# Patient Record
Sex: Female | Born: 1960 | Race: White | Hispanic: No | Marital: Married | Smoking: Former smoker
Health system: Southern US, Community
[De-identification: ages and names within clinical notes are randomized; demographics above are authoritative.]

## PROBLEM LIST (undated history)

## (undated) DIAGNOSIS — H269 Unspecified cataract: Secondary | ICD-10-CM

## (undated) DIAGNOSIS — T7840XA Allergy, unspecified, initial encounter: Secondary | ICD-10-CM

## (undated) DIAGNOSIS — R569 Unspecified convulsions: Secondary | ICD-10-CM

## (undated) DIAGNOSIS — N390 Urinary tract infection, site not specified: Secondary | ICD-10-CM

## (undated) DIAGNOSIS — E119 Type 2 diabetes mellitus without complications: Secondary | ICD-10-CM

## (undated) DIAGNOSIS — R011 Cardiac murmur, unspecified: Secondary | ICD-10-CM

## (undated) DIAGNOSIS — J45909 Unspecified asthma, uncomplicated: Secondary | ICD-10-CM

## (undated) DIAGNOSIS — K219 Gastro-esophageal reflux disease without esophagitis: Secondary | ICD-10-CM

## (undated) DIAGNOSIS — G43909 Migraine, unspecified, not intractable, without status migrainosus: Secondary | ICD-10-CM

## (undated) DIAGNOSIS — J40 Bronchitis, not specified as acute or chronic: Secondary | ICD-10-CM

## (undated) DIAGNOSIS — I10 Essential (primary) hypertension: Secondary | ICD-10-CM

## (undated) DIAGNOSIS — F419 Anxiety disorder, unspecified: Secondary | ICD-10-CM

## (undated) DIAGNOSIS — E785 Hyperlipidemia, unspecified: Secondary | ICD-10-CM

## (undated) HISTORY — PX: COLONOSCOPY: SHX174

## (undated) HISTORY — DX: Hyperlipidemia, unspecified: E78.5

## (undated) HISTORY — DX: Unspecified convulsions: R56.9

## (undated) HISTORY — DX: Essential (primary) hypertension: I10

## (undated) HISTORY — DX: Bronchitis, not specified as acute or chronic: J40

## (undated) HISTORY — PX: HERNIA REPAIR: SHX51

## (undated) HISTORY — DX: Unspecified cataract: H26.9

## (undated) HISTORY — DX: Urinary tract infection, site not specified: N39.0

## (undated) HISTORY — PX: CHOLECYSTECTOMY: SHX55

## (undated) HISTORY — DX: Allergy, unspecified, initial encounter: T78.40XA

## (undated) HISTORY — DX: Anxiety disorder, unspecified: F41.9

## (undated) HISTORY — DX: Gastro-esophageal reflux disease without esophagitis: K21.9

## (undated) HISTORY — DX: Cardiac murmur, unspecified: R01.1

## (undated) HISTORY — PX: ESOPHAGOGASTRODUODENOSCOPY: SHX1529

## (undated) HISTORY — DX: Type 2 diabetes mellitus without complications: E11.9

## (undated) HISTORY — DX: Unspecified asthma, uncomplicated: J45.909

## (undated) HISTORY — PX: BREAST LUMPECTOMY: SHX2

## (undated) HISTORY — DX: Migraine, unspecified, not intractable, without status migrainosus: G43.909

---

## 1981-10-09 HISTORY — PX: PARTIAL HYSTERECTOMY: SHX80

## 1991-10-10 HISTORY — PX: HIP ARTHROSCOPY: SUR88

## 2017-11-14 DIAGNOSIS — I10 Essential (primary) hypertension: Secondary | ICD-10-CM | POA: Diagnosis not present

## 2017-11-14 DIAGNOSIS — R079 Chest pain, unspecified: Secondary | ICD-10-CM

## 2017-11-14 DIAGNOSIS — E119 Type 2 diabetes mellitus without complications: Secondary | ICD-10-CM

## 2017-11-14 DIAGNOSIS — E875 Hyperkalemia: Secondary | ICD-10-CM

## 2018-10-09 HISTORY — PX: BREAST BIOPSY: SHX20

## 2019-03-05 ENCOUNTER — Telehealth (INDEPENDENT_AMBULATORY_CARE_PROVIDER_SITE_OTHER): Payer: Medicare Other | Admitting: Gastroenterology

## 2019-03-05 ENCOUNTER — Other Ambulatory Visit: Payer: Self-pay

## 2019-03-05 ENCOUNTER — Encounter: Payer: Self-pay | Admitting: Gastroenterology

## 2019-03-05 VITALS — Ht 61.0 in | Wt 185.0 lb

## 2019-03-05 DIAGNOSIS — Z1211 Encounter for screening for malignant neoplasm of colon: Secondary | ICD-10-CM | POA: Diagnosis not present

## 2019-03-05 DIAGNOSIS — K449 Diaphragmatic hernia without obstruction or gangrene: Secondary | ICD-10-CM

## 2019-03-05 DIAGNOSIS — R1310 Dysphagia, unspecified: Secondary | ICD-10-CM | POA: Diagnosis not present

## 2019-03-05 DIAGNOSIS — K219 Gastro-esophageal reflux disease without esophagitis: Secondary | ICD-10-CM

## 2019-03-05 DIAGNOSIS — R112 Nausea with vomiting, unspecified: Secondary | ICD-10-CM

## 2019-03-05 DIAGNOSIS — R1319 Other dysphagia: Secondary | ICD-10-CM

## 2019-03-05 NOTE — Progress Notes (Signed)
Chief Complaint: GERD, hiatal hernia, dysphagia  Referring Provider:     Self   HPI:    Due to current restrictions/limitations of in-office visits due to the COVID-19 pandemic, this scheduled clinical appointment was converted to a telehealth virtual consultation.  -Time of medical discussion: 25 minutes -The patient did consent to this virtual visit and is aware of possible charges through their insurance for this visit.  -Names of all parties present: Marie Andrews (patient), Doristine LocksVito Bryah Ocheltree, DO, [facility] ([facility]ician) -Patient location: Home -Physician location: Office  Marie Andrews is a 58 y.o. female with a history of hyperlipidemia, hypertension, hypomagnesemia, former smoker, PVCs referred to the Gastroenterology Clinic for evaluation of GERD. Has had reflux sxs since 2015, with index sxs of HB, regurgitation, sourbrash. Does have nocturnal regurgitation and occasional post prandial non-bloody emesis. Worse with forward flexion. Worse with fried foods. Currently treated w/ Dexilant 60 mg daily and Pepcid 20 mg qhs. Has been on acid suppression therapy since 2016 (previously Prilosec, Protonix, Nexium with suboptimal response). Still with breakthrough post prandial sxs approx 2 times/week. Occasional dysphagia with both solids and liquids, pointing to suprasternal notch.  She is specifically interested in hiatal hernia repair and Transoral Incision was Fundoplication.  I completed the same for her son earlier this year, and he has been able to wean off PPI therapy completely.  She is hoping for the same goal of stopping or significantly reducing acid suppression therapy.  [place] in [place] in approx 2016 and she reports n/f hernia and reflux changes. Does not recall if esophageal dilation done.   Most recent labs from 12/2017; magnesium 1.1, otherwise normal BMP.  No other labs or imaging studies for review. Taking Mag supplement.   Of note, she has multiple  medication allergies, to include ampicillin, cephalexin, sulfamethoxazole, penicillin, oxycodone (all anaphylaxis), Tylenol (rash), strawberry (cardiovascular risk).  Colonoscopy completed approx 2016 in [place], but does not recall results.   Past medical history, past surgical history, social history, family history, medications, and allergies reviewed in the chart and with patient.    Past Medical History:  Diagnosis Date  . Allergies   . Asthma   . Bronchitis   . GERD (gastroesophageal reflux disease)   . Hyperlipidemia   . Hypertension   . Migraines   . UTI (urinary tract infection)      Past Surgical History:  Procedure Laterality Date  . BREAST LUMPECTOMY Right    tumor. Age 58   . COLONOSCOPY     x2 Last one done around 2016-2017 [facility] [place]  . ESOPHAGOGASTRODUODENOSCOPY     x2 last one done 2016[place]2017 [place] [facility]  . HERNIA REPAIR     age 89[place]  . HIP ARTHROSCOPY Left 1993   Tumor was removed   . PARTIAL HYSTERECTOMY  1983   Family History  Problem Relation Age of Onset  . Breast cancer Sister   . Kidney cancer Sister        said the cancer spreaded to lungs and all over  . Colon cancer Neg Hx   . Esophageal cancer Neg Hx    Social History   Tobacco Use  . Smoking status: Former Games developermoker  . Smokeless tobacco: Never Used  . Tobacco comment: quit 2 years ago  Substance Use Topics  . Alcohol use: Not Currently    Comment: said  she  is allergic  . Drug use: Never   Current Outpatient Medications  Medication Sig Dispense Refill  . albuterol (PROVENTIL) (2.5 MG/3ML) 0.083% nebulizer solution Inhale into the lungs 3 (three) times daily.    Marland Kitchen amLODipine (NORVASC) 5 MG tablet Take 1 tablet by mouth daily.    Marland Kitchen atorvastatin (LIPITOR) 20 MG tablet Take 1 tablet by mouth daily.    . carvedilol (COREG) 6.25 MG tablet 1 tablet 2 (two) times daily.    Marland Kitchen dexlansoprazole (DEXILANT) 60 MG capsule Take 1  capsule by mouth daily.    . famotidine (PEPCID) 20 MG tablet 1 tablet daily as needed.    . fluticasone (FLONASE) 50 MCG/ACT nasal spray Place 1 spray into the nose daily.    . fluticasone-salmeterol (ADVAIR HFA) 115-21 MCG/ACT inhaler Inhale 2 puffs into the lungs daily.    . meloxicam (MOBIC) 7.5 MG tablet 1 tablet daily.    . metFORMIN (GLUCOPHAGE) 1000 MG tablet Take 1 tablet by mouth 2 (two) times daily.    . montelukast (SINGULAIR) 10 MG tablet Take 1 tablet by mouth daily.    . nitrofurantoin, macrocrystal-monohydrate, (MACROBID) 100 MG capsule 1 capsule 2 (two) times daily.    Marland Kitchen olmesartan (BENICAR) 20 MG tablet Take 1 tablet by mouth daily.    . potassium chloride (KLOR-CON) 8 MEQ tablet 1 tablet daily.    . predniSONE (DELTASONE) 20 MG tablet Take 40 mg by mouth daily.    . SUMAtriptan (IMITREX) 100 MG tablet Take 1 tablet by mouth as needed.    . topiramate (TOPAMAX) 25 MG tablet 1 tablet daily.    . VENTOLIN HFA 108 (90 Base) MCG/ACT inhaler 1 puff 2 (two) times daily.    . Vitamin D, Ergocalciferol, (DRISDOL) 1.25 MG (50000 UT) CAPS capsule Take 1 capsule by mouth once a week.     No current facility-administered medications for this visit.    Allergies  Allergen Reactions  . Ampicillin Anaphylaxis  . Cephalexin Anaphylaxis  . Oxycodone Anaphylaxis  . Penicillin G Anaphylaxis  . Sulfamethoxazole Anaphylaxis  . Strawberry Extract     Other reaction(s): Cardiovascular Arrest (ALLERGY/intolerance)  . Acetaminophen Rash     Review of Systems: All systems reviewed and negative except where noted in HPI.     Physical Exam:    Physical exam not completed due to the nature of this telehealth communication.  Patient was otherwise alert and oriented and well communicative.   ASSESSMENT AND PLAN;   1) GERD 2) Hiatal hernia 3) Dysphagia  Longstanding history of reflux, incompletely controlled with high-dose aspiration therapy.  Additionally, with dysphagia to both  solids and liquids.  She is very interested in an antireflux surgery for a goal of stopping or significantly reducing aspiration therapy.  -Resume Dexilant and Pepcid as prescribed -Resume antireflux lifestyle measures -EGD to evaluate for erosive esophagitis, size of hiatal hernia, LES laxity - Plan for esophageal dilation as indicated at time of EGD - Depending on endoscopic findings, may need to proceed with pH/impedance study with or without EM   4) Nausea/Vomiting Unclear if this is regurgitation/refluxate versus true nausea/vomiting. -Evaluate for mucosal/luminal etiology at time of EGD -Evaluate for GOO - Depending on endoscopic findings, may need to consider GES, particularly if considering antireflux surgery - If all unrevealing, may need to consider CT head?  5) Hypomagnesemia: - Possibly secondary to chronic PPI use - Check BMP and mag level -Resume mag supplement  6) CRC screening: - Colonoscopy in [place] in approximately 2016.  She does not recall the results.  If unable to obtain records, may consider repeat colonoscopy at age 36.  The indications, risks, and benefits of EGD were explained to the patient in detail. Risks include but are not limited to bleeding, perforation, adverse reaction to medications, and cardiopulmonary compromise. Sequelae include but are not limited to the possibility of surgery, hositalization, and mortality. The patient verbalized understanding and wished to proceed. All questions answered, referred to scheduler. Further recommendations pending results of the exam.     Shellia Cleverly, DO, FACG  03/05/2019, 10:40 AM   Yisroel Ramming, MD

## 2019-03-05 NOTE — Patient Instructions (Signed)
If you are age 60 or older, your body mass index should be between 23-30. Your Body mass index is 34.96 kg/m. If this is out of the aforementioned range listed, please consider follow up with your Primary Care Provider.  If you are age 38 or younger, your body mass index should be between 19-25. Your Body mass index is 34.96 kg/m. If this is out of the aformentioned range listed, please consider follow up with your Primary Care Provider.   You have been scheduled for an endoscopy. Please follow written instructions given to you at your visit today. If you use inhalers (even only as needed), please bring them with you on the day of your procedure. Your physician has requested that you go to www.startemmi.com and enter the access code given to you at your visit today. This web site gives a general overview about your procedure. However, you should still follow specific instructions given to you by our office regarding your preparation for the procedure.  To help prevent the possible spread of infection to our patients, communities, and staff; we will be implementing the following measures:  As of now we are not allowing any visitors/family members to accompany you to any upcoming appointments with [facility]. If you have any concerns about this please contact our office to discuss prior to the appointment.   Please go to our [place] location for your labwork at [facility] at ([address] [place]). You do not need an appointment just go to the level "B" where the lab is located.  It was a pleasure to see you today!  Vito Cirigliano, D.O.

## 2019-03-06 NOTE — Progress Notes (Signed)
Addendum: Received copy of previous EGD and colonoscopy reports from [facility] in [place], [place].  -EGD (01/16/2014): Mildly irritated esophageal mucosa, irregular Z line at 35 cm consistent with reflux (biopsies show SCJ with mild inflammation and changes of reflux), small hiatal hernia, non-H. pylori gastritis with antral erosions, normal duodenum.  Of note, gastric contraction was very limited and basically no contractions noted during the procedure with suspicion for dysmotility or gastroparesis. - Colonoscopy (01/16/2014): Internal hemorrhoids, diverticulosis, benign sigmoid polyp, 2 tubular adenomas in the transverse colon measuring 6 to 7 mm.  Otherwise normal.  Patient had a seizure during colonoscopy, treated with IV Versed.  Would be reasonable to repeat colonoscopy in 2020 given to tubular adenomas in 2015 (although guidelines have changed since then, and 7-year interval now suggested).  Otherwise, plan as outlined previously and will likely proceed with GES pending endoscopy findings.

## 2019-03-19 ENCOUNTER — Telehealth: Payer: Self-pay | Admitting: *Deleted

## 2019-03-19 NOTE — Telephone Encounter (Signed)

## 2019-03-21 ENCOUNTER — Encounter: Payer: Medicare Other | Admitting: Gastroenterology

## 2019-04-04 ENCOUNTER — Other Ambulatory Visit: Payer: Self-pay | Admitting: Nurse Practitioner

## 2019-04-04 DIAGNOSIS — R928 Other abnormal and inconclusive findings on diagnostic imaging of breast: Secondary | ICD-10-CM

## 2019-04-10 ENCOUNTER — Ambulatory Visit
Admission: RE | Admit: 2019-04-10 | Discharge: 2019-04-10 | Disposition: A | Discharge status: Home / Self Care | Payer: Medicare Other | Source: Ambulatory Visit | Attending: Nurse Practitioner | Admitting: Nurse Practitioner

## 2019-04-10 ENCOUNTER — Other Ambulatory Visit: Payer: Self-pay

## 2019-04-10 DIAGNOSIS — R928 Other abnormal and inconclusive findings on diagnostic imaging of breast: Secondary | ICD-10-CM

## 2019-05-15 ENCOUNTER — Telehealth: Payer: Self-pay | Admitting: *Deleted

## 2019-05-15 NOTE — Telephone Encounter (Signed)
Marie Andrews  This pt had a tele med visit with Dr Bryan Lemma 03-05-2019 - she is scheduled for an EGD 05-29-2019  Thursday- In his note at the tele med visit and in the colon report it is noted this pt had seizure like activity during her colonoscopy 01-16-2014 that was treated with IV Versed -  Will you please review her chart to clarify if she qualifies for [facility] EGD ?  Thanks, [name] PV

## 2019-05-15 NOTE — Telephone Encounter (Signed)
Marie,  This pt is cleared for anesthetic care at [facility].  Thanks,  Rayane Gallardo 

## 2019-05-19 ENCOUNTER — Ambulatory Visit: Payer: Medicare Other | Admitting: *Deleted

## 2019-05-19 ENCOUNTER — Other Ambulatory Visit: Payer: Self-pay

## 2019-05-19 VITALS — Ht 61.0 in | Wt 185.0 lb

## 2019-05-19 DIAGNOSIS — K219 Gastro-esophageal reflux disease without esophagitis: Secondary | ICD-10-CM

## 2019-05-19 NOTE — Progress Notes (Signed)
Previsit via telephone, ID per name,DOB and address.  No egg or soy allergy known to patient  No issues with past sedation with any surgeries  or procedures, no intubation problems  No diet pills per patient No home 02 use per patient  No blood thinners per patient  Pt denies issues with constipation  No A fib or A flutter  EMMI  Information,consent,acknowledgement form and instructions mailed to the patient via mail. Patient aware. Patient stating no seizure since 2015

## 2019-05-28 ENCOUNTER — Telehealth: Payer: Self-pay | Admitting: Gastroenterology

## 2019-05-28 ENCOUNTER — Ambulatory Visit: Payer: Medicare Other | Admitting: Gastroenterology

## 2019-05-28 NOTE — Telephone Encounter (Signed)

## 2019-05-29 ENCOUNTER — Encounter: Payer: Medicare Other | Admitting: Gastroenterology

## 2020-04-16 IMAGING — MG MM CLIP PLACEMENT
4 series · 4 of 12 positions shown · non-contrast
Comparison: Previous exam(s).

CLINICAL DATA: Post stereotactic guided biopsy of distortion in the
upper-outer left breast.

EXAM:
DIAGNOSTIC LEFT MAMMOGRAM POST STEREOTACTIC BIOPSY

[L ML synth-2D]
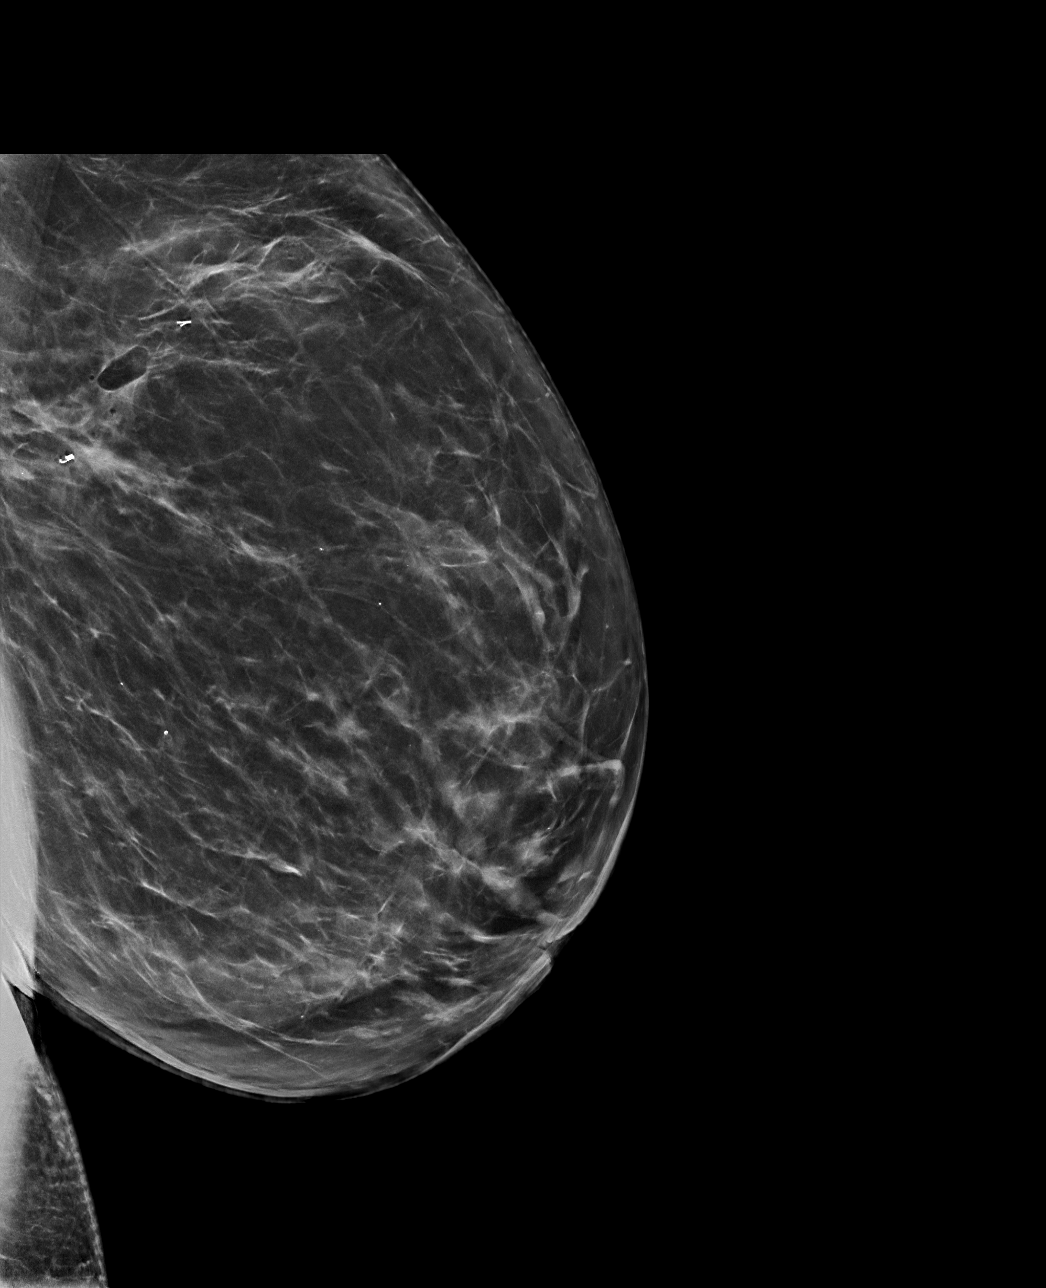

[L CC synth-2D]
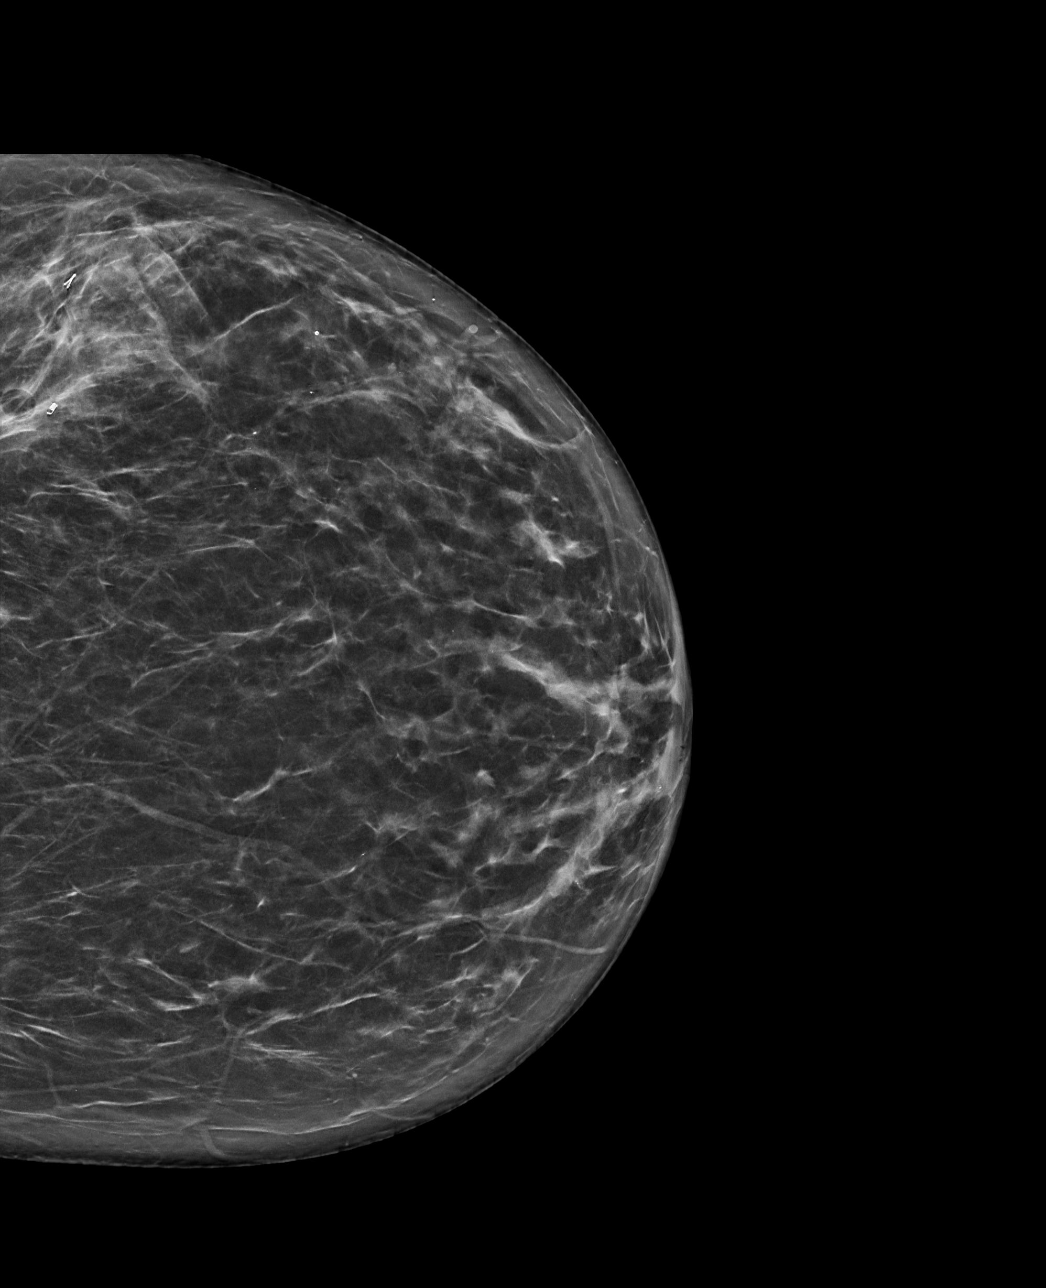

[L CC tomo · tomo slice 38/75.0]
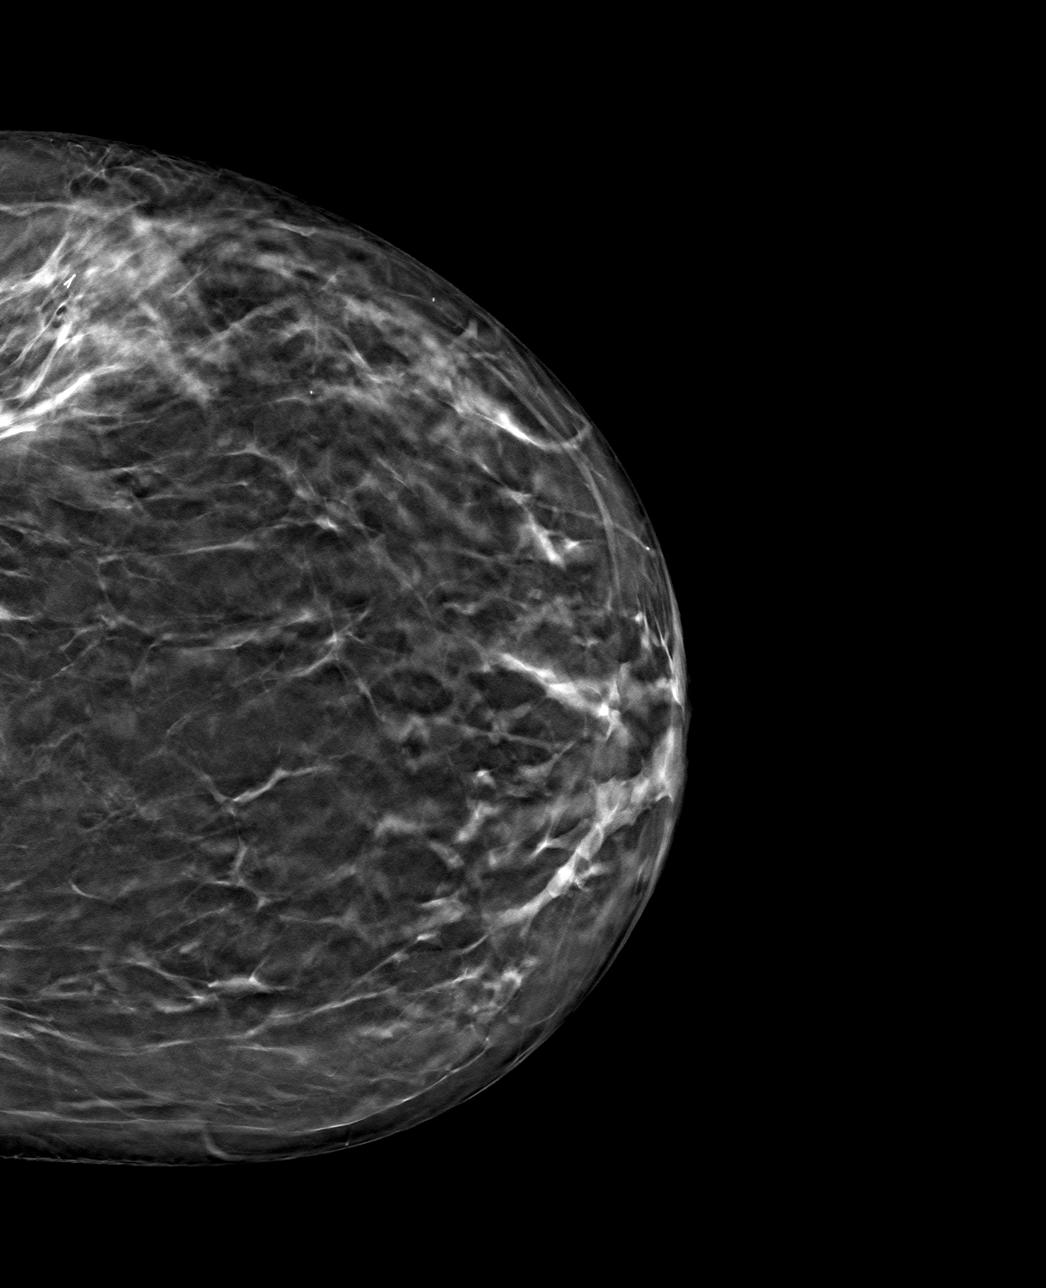

[L ML tomo · tomo slice 42/83.0]
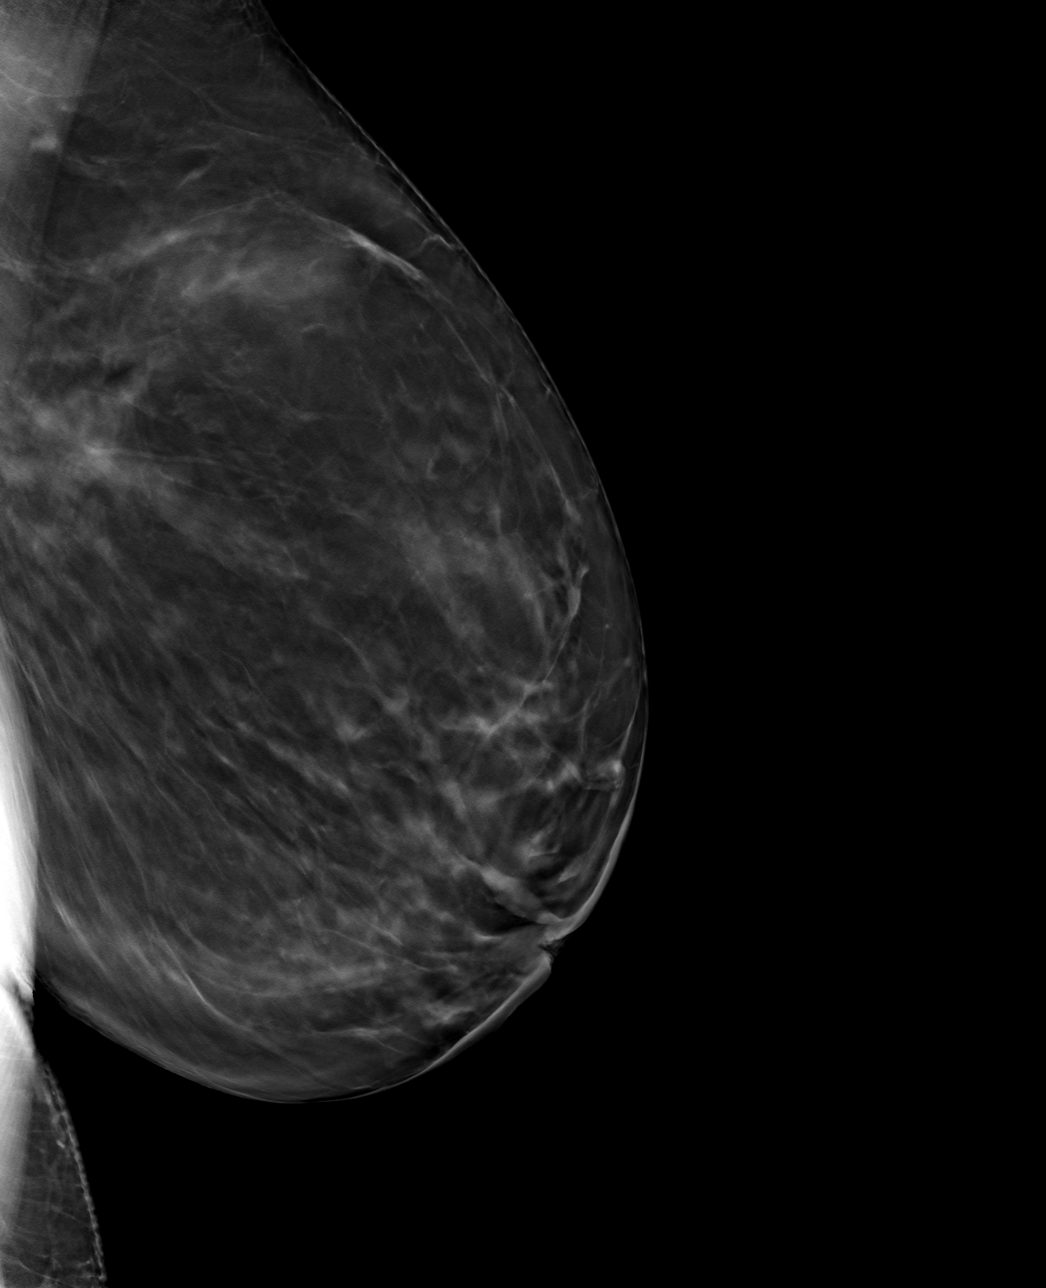

[4 of 12 positions shown; findings below may reference images not displayed]

FINDINGS: Mammographic images were obtained following stereotactic guided
biopsy of distortion in the upper-outer left breast. A coil shaped
biopsy marking clip is present and felt to be located along the
medial margin of the biopsied distortion in the upper-outer left
breast.
IMPRESSION: Coil shaped biopsy marking clip felt to be located along the medial
margin of the biopsied distortion in the upper-outer left breast.

Final Assessment: Post Procedure Mammograms for Marker Placement

## 2020-04-16 IMAGING — MG STEREOTACTIC CORE NEEDLE BIOPSY
8 of 12 series · 8 of 32 positions shown · non-contrast
Comparison: Previous exams.
COMPARISON: Previous exams.

Addendum:
CLINICAL DATA: 57-year-old female with distortion in the
upper-outer left breast adjacent to site of prior benign biopsy.

EXAM:
LEFT BREAST STEREOTACTIC CORE NEEDLE BIOPSY

[L (1 of 6)]
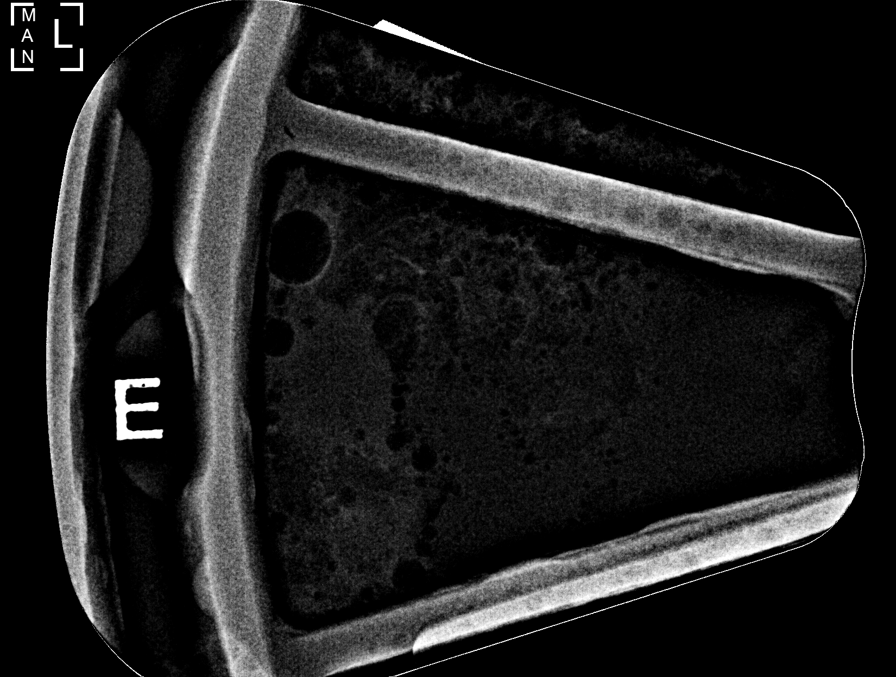

[L (2 of 6)]
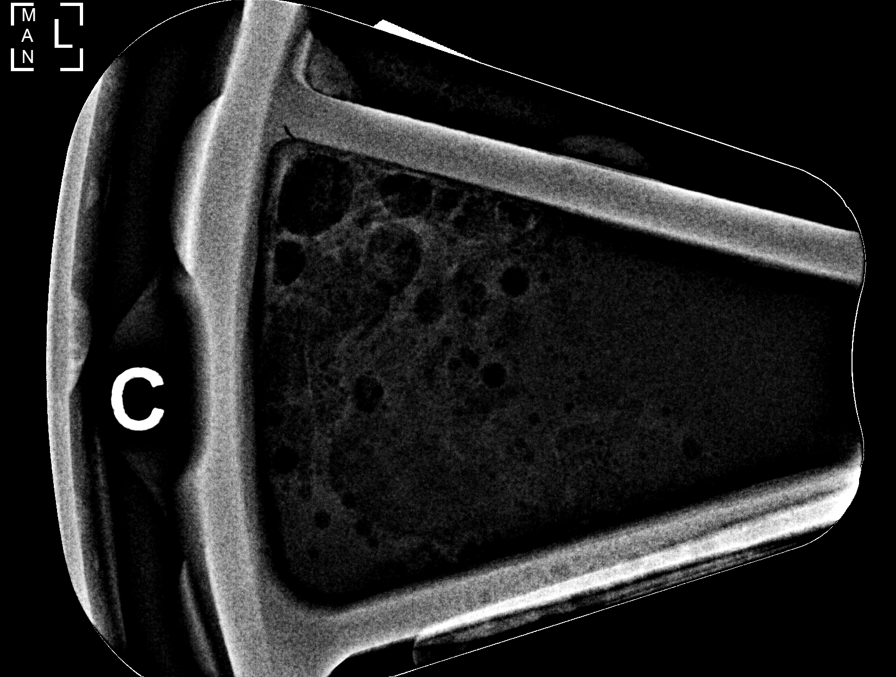

[L (3 of 6)]
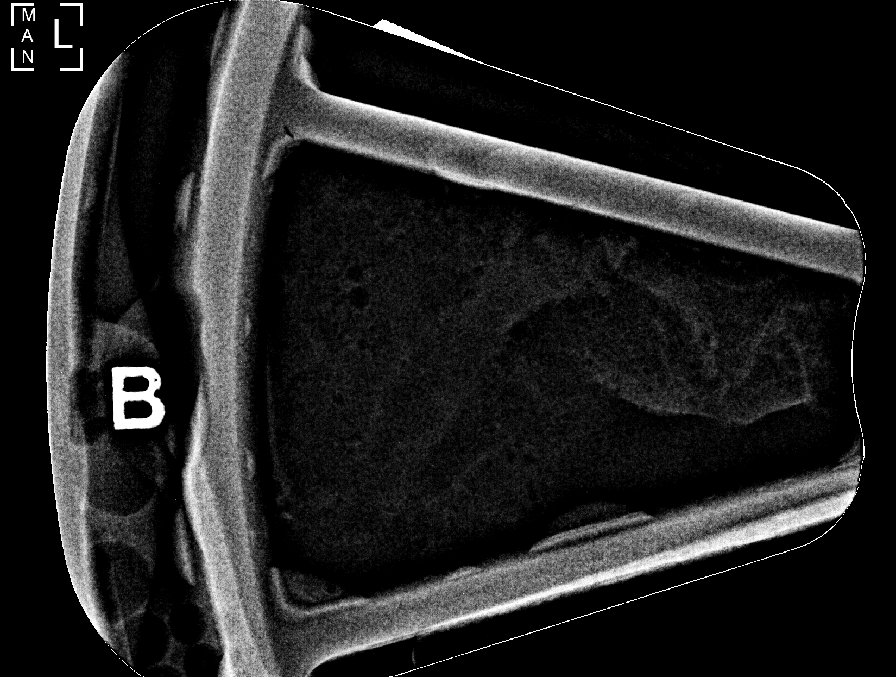

[L (4 of 6)]
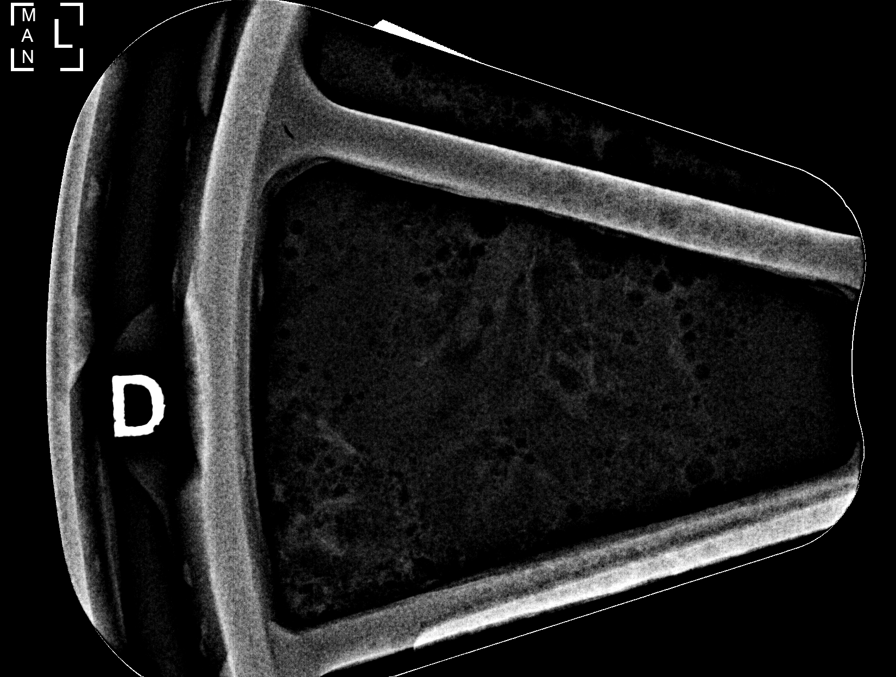

[L (5 of 6)]
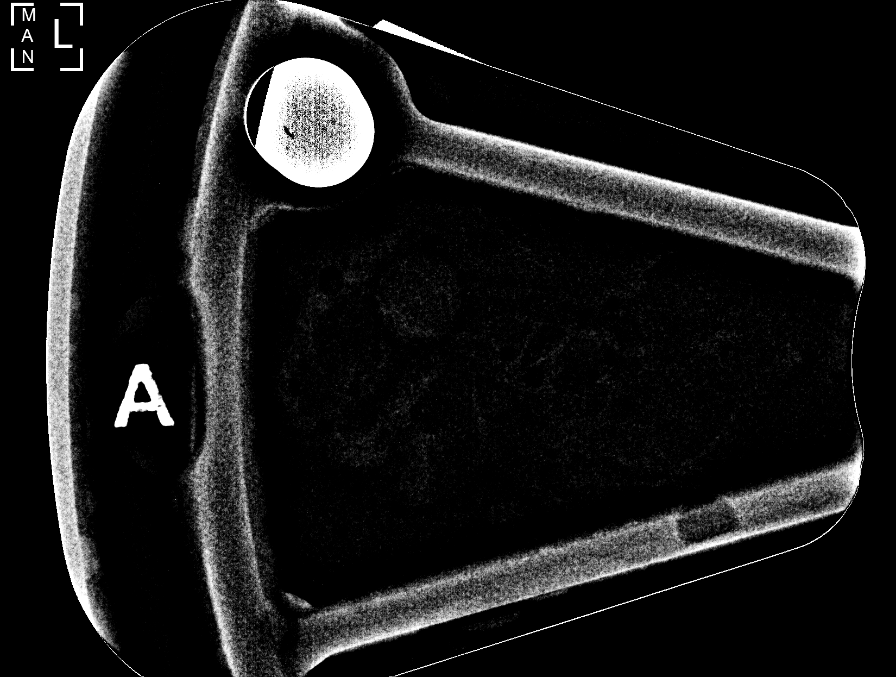

[L (6 of 6)]
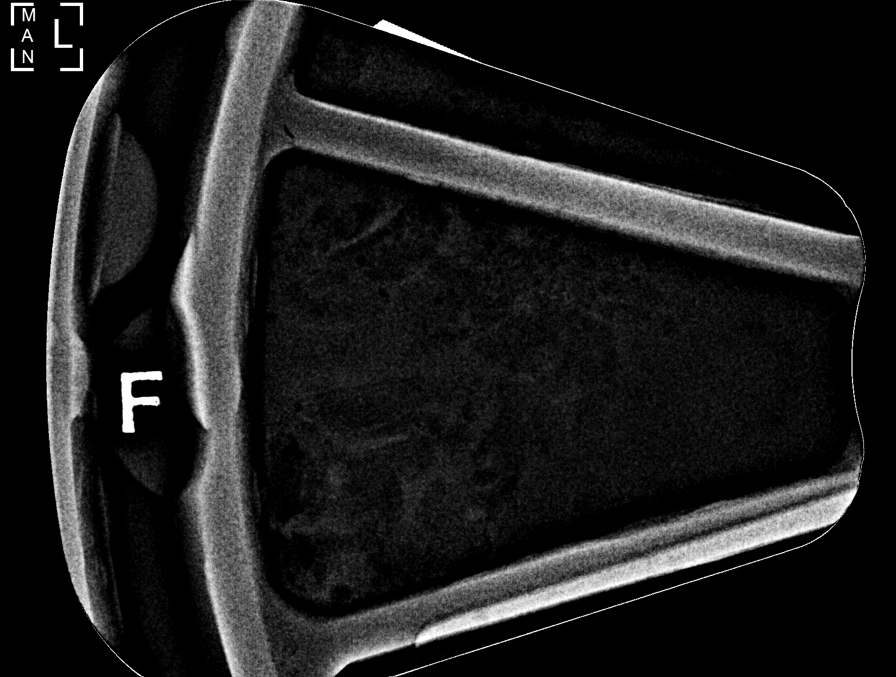

[L CC]
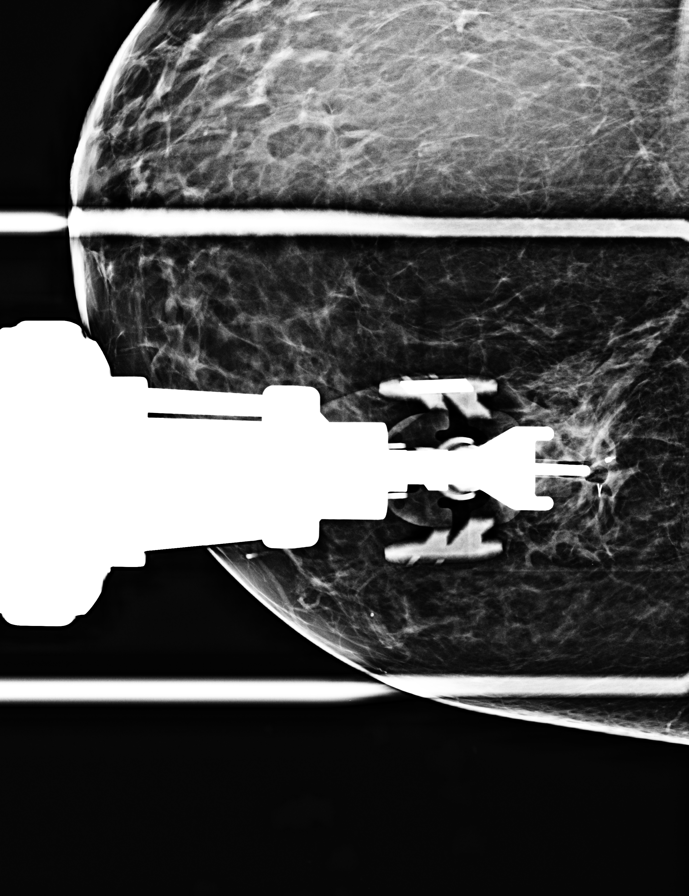

[L CC tomo · tomo slice 27/52.0]
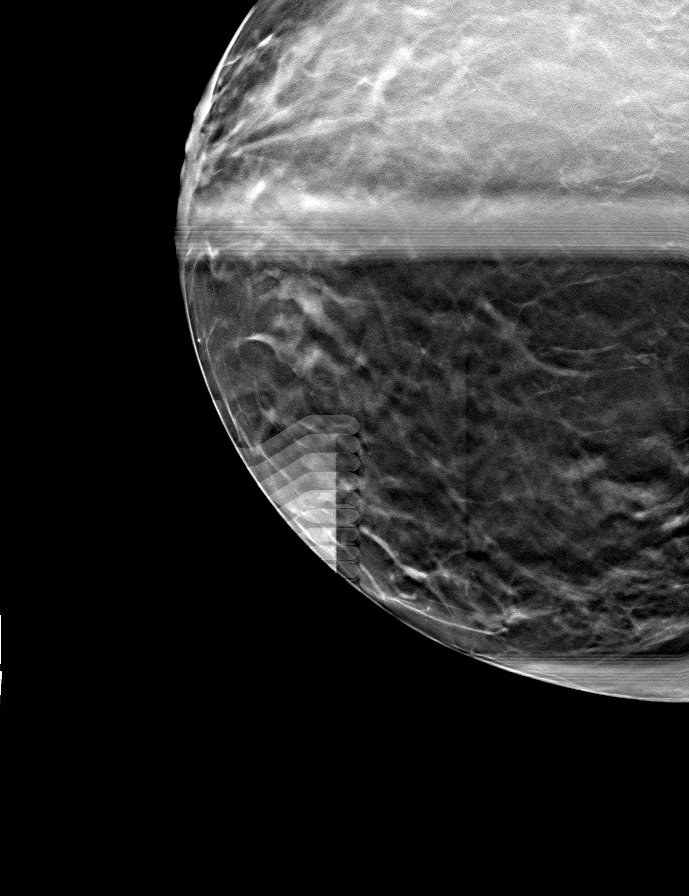

[8 of 32 positions shown; findings below may reference images not displayed]



Using sterile technique and 1% Lidocaine as local anesthetic, under
stereotactic guidance, a 9 gauge vacuum assisted device was used to
perform core needle biopsy of the subtle distortion in the
upper-outer left breast just medial to the ribbon shaped biopsy
marking clip using a superior to inferior approach.

Lesion quadrant: Upper-outer

At the conclusion of the procedure, a coil shaped tissue marker clip
was deployed into the biopsy cavity. Follow-up 2-view mammogram was
performed and dictated separately.
IMPRESSION: Stereotactic-guided biopsy of the distortion in the upper-outer left
breast. No apparent complications.

ADDENDUM:
Pathology revealed BENIGN BREAST TISSUE WITH FAT NECROSIS PRESENT
FOCALLY of the LEFT breast, upper outer quadrant. This was found to
be concordant by Dr. Winnie Tiger.

Pathology results were discussed with the patient by telephone. The
patient reported doing well after the biopsy with tenderness at the
site. Post biopsy instructions and care were reviewed and questions
were answered. The patient was encouraged to call The [REDACTED]

The patient was instructed to return for annual screening
mammography.

Pathology results reported by Dianelys Gillum, RN on 04/14/2019.



Using sterile technique and 1% Lidocaine as local anesthetic, under
stereotactic guidance, a 9 gauge vacuum assisted device was used to
perform core needle biopsy of the subtle distortion in the
upper-outer left breast just medial to the ribbon shaped biopsy
marking clip using a superior to inferior approach.

Lesion quadrant: Upper-outer

At the conclusion of the procedure, a coil shaped tissue marker clip
was deployed into the biopsy cavity. Follow-up 2-view mammogram was
performed and dictated separately.
IMPRESSION: Stereotactic-guided biopsy of the distortion in the upper-outer left
breast. No apparent complications.

## 2023-09-26 LAB — EXTERNAL GENERIC LAB PROCEDURE: COLOGUARD: POSITIVE — AB

## 2023-12-03 ENCOUNTER — Ambulatory Visit
Admission: RE | Admit: 2023-12-03 | Discharge: 2023-12-03 | Disposition: A | Discharge status: Home / Self Care | Payer: Medicare Other | Source: Ambulatory Visit | Attending: Family Medicine | Admitting: Family Medicine

## 2023-12-03 ENCOUNTER — Other Ambulatory Visit: Payer: Self-pay | Admitting: Family Medicine

## 2023-12-03 DIAGNOSIS — Z1231 Encounter for screening mammogram for malignant neoplasm of breast: Secondary | ICD-10-CM

## 2023-12-07 ENCOUNTER — Other Ambulatory Visit: Payer: Self-pay | Admitting: Family Medicine

## 2023-12-07 DIAGNOSIS — R928 Other abnormal and inconclusive findings on diagnostic imaging of breast: Secondary | ICD-10-CM

## 2024-02-22 ENCOUNTER — Ambulatory Visit
Admission: RE | Admit: 2024-02-22 | Discharge: 2024-02-22 | Disposition: A | Discharge status: Home / Self Care | Source: Ambulatory Visit | Attending: Family Medicine | Admitting: Family Medicine

## 2024-02-22 ENCOUNTER — Ambulatory Visit

## 2024-02-22 DIAGNOSIS — R928 Other abnormal and inconclusive findings on diagnostic imaging of breast: Secondary | ICD-10-CM
